# Patient Record
Sex: Female | Born: 2005
Health system: Southern US, Community
[De-identification: ages and names within clinical notes are randomized; demographics above are authoritative.]

## PROBLEM LIST (undated history)

## (undated) DIAGNOSIS — F419 Anxiety disorder, unspecified: Secondary | ICD-10-CM

## (undated) DIAGNOSIS — R5383 Other fatigue: Secondary | ICD-10-CM

## (undated) DIAGNOSIS — F900 Attention-deficit hyperactivity disorder, predominantly inattentive type: Secondary | ICD-10-CM

## (undated) DIAGNOSIS — J45909 Unspecified asthma, uncomplicated: Secondary | ICD-10-CM

## (undated) HISTORY — PX: ENDOTRACHEAL INTUBATION EMERGENT: SUR448

## (undated) HISTORY — PX: GASTROSTOMY TUBE PLACEMENT: SHX655

## (undated) HISTORY — DX: Anxiety disorder, unspecified: F41.9

## (undated) HISTORY — PX: FRACTURE SURGERY: SHX138

## (undated) HISTORY — DX: Other fatigue: R53.83

## (undated) HISTORY — DX: Unspecified asthma, uncomplicated: J45.909

## (undated) HISTORY — DX: Attention-deficit hyperactivity disorder, predominantly inattentive type: F90.0

---
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
## 2023-01-30 ENCOUNTER — Other Ambulatory Visit: Payer: Self-pay

## 2023-01-30 ENCOUNTER — Emergency Department (HOSPITAL_COMMUNITY)
Admission: EM | Admit: 2023-01-30 | Discharge: 2023-01-30 | Disposition: A | Discharge status: Home / Self Care | Payer: 59 | Attending: Emergency Medicine | Admitting: Emergency Medicine

## 2023-01-30 ENCOUNTER — Emergency Department (HOSPITAL_COMMUNITY): Payer: 59

## 2023-01-30 ENCOUNTER — Encounter (HOSPITAL_COMMUNITY): Payer: Self-pay

## 2023-01-30 DIAGNOSIS — R079 Chest pain, unspecified: Secondary | ICD-10-CM | POA: Insufficient documentation

## 2023-01-30 LAB — TROPONIN I (HIGH SENSITIVITY)
Troponin I (High Sensitivity): <2 ng/L (ref <18)
Troponin I (High Sensitivity): <2 ng/L (ref <18)

## 2023-01-30 LAB — BASIC METABOLIC PANEL
Anion gap: 10 (ref 5–15)
BUN: 9 mg/dL (ref 6–20)
CO2: 24 mmol/L (ref 22–32)
Calcium: 9.2 mg/dL (ref 8.9–10.3)
Chloride: 105 mmol/L (ref 98–111)
Creatinine, Ser: 0.81 mg/dL (ref 0.44–1.00)
GFR, Estimated: >60 mL/min (ref >60)
Glucose, Bld: 91 mg/dL (ref 70–99)
Potassium: 3.8 mmol/L (ref 3.5–5.1)
Sodium: 139 mmol/L (ref 135–145)

## 2023-01-30 LAB — CBC
HCT: 41.5 % (ref 36.0–46.0)
Hemoglobin: 13.7 g/dL (ref 12.0–15.0)
MCH: 30.3 pg (ref 26.0–34.0)
MCHC: 33 g/dL (ref 30.0–36.0)
MCV: 91.8 fL (ref 80.0–100.0)
Platelets: 278 10*3/uL (ref 150–400)
RBC: 4.52 MIL/uL (ref 3.87–5.11)
RDW: 12.4 % (ref 11.5–15.5)
WBC: 8.2 10*3/uL (ref 4.0–10.5)
nRBC: 0 % (ref 0.0–0.2)

## 2023-01-30 LAB — HCG, SERUM, QUALITATIVE: Preg, Serum: NEGATIVE

## 2023-01-30 LAB — D-DIMER, QUANTITATIVE: D-Dimer, Quant: 0.38 ug{FEU}/mL (ref 0.00–0.50)

## 2023-01-30 MED ORDER — CELECOXIB 200 MG PO CAPS: 200.0000 mg | ORAL_CAPSULE | Freq: Two times a day (BID) | ORAL | 0 refills | Status: AC

## 2023-01-30 MED ORDER — KETOROLAC TROMETHAMINE 15 MG/ML IJ SOLN
30.0000 mg | Freq: Once | INTRAMUSCULAR | Status: AC
  Administered 2023-01-30: 30 mg via INTRAVENOUS
  Filled 2023-01-30: qty 2

## 2023-01-30 NOTE — ED Triage Notes (Addendum)
Patient reports central chest pain that radiates to the left shoulder and increased SOB with movement x 2 days. Patient reports she took tums without relief. Patient denies cardiac hx.

## 2023-01-30 NOTE — ED Provider Notes (Signed)
[place] EMERGENCY DEPARTMENT AT [facility] Provider Note   CSN: [id number] Arrival date & time: 01/30/23  1254     History Chief Complaint  Patient presents with   Chest Pain    HPI Jennifer Franklin is a 17 y.o. female presenting for chief complaint of chest pain. .  States that her chest started hurting 2 days ago has been progressive in nature. Intermittent symptoms throughout the day today.  Currently present.  Worse with motion worse with palpation of the left anterior chest.  Denies fevers chills nausea vomiting syncope or shortness of breath.  Patient's recorded medical, surgical, social, medication list and allergies were reviewed in the Snapshot window as part of the initial history.   Review of Systems   Review of Systems  Constitutional:  Negative for chills and fever.  HENT:  Negative for ear pain and sore throat.   Eyes:  Negative for pain and visual disturbance.  Respiratory:  Negative for cough and shortness of breath.   Cardiovascular:  Positive for chest pain. Negative for palpitations.  Gastrointestinal:  Negative for abdominal pain and vomiting.  Genitourinary:  Negative for dysuria and hematuria.  Musculoskeletal:  Negative for arthralgias and back pain.  Skin:  Negative for color change and rash.  Neurological:  Negative for seizures and syncope.  All other systems reviewed and are negative.   Physical Exam Updated Vital Signs BP 128/84   Pulse 70   Temp 97.8 F (36.6 C) (Oral)   Resp 16   Ht 5\' 6"  (1.676 m)   Wt 90.7 kg   LMP 01/28/2023   SpO2 100%   BMI 32.28 kg/m  Physical Exam Vitals and nursing note reviewed.  Constitutional:      General: She is not in acute distress.    Appearance: She is well-developed.  HENT:     Head: Normocephalic and atraumatic.  Eyes:     Conjunctiva/sclera: Conjunctivae normal.  Cardiovascular:     Rate and Rhythm: Normal rate and regular rhythm.     Heart sounds: No murmur heard. Pulmonary:      Effort: Pulmonary effort is normal. No respiratory distress.     Breath sounds: Normal breath sounds.  Abdominal:     Palpations: Abdomen is soft.     Tenderness: There is no abdominal tenderness.  Musculoskeletal:        General: No swelling.     Cervical back: Neck supple.  Skin:    General: Skin is warm and dry.     Capillary Refill: Capillary refill takes less than 2 seconds.  Neurological:     Mental Status: She is alert.  Psychiatric:        Mood and Affect: Mood normal.      ED Course/ Medical Decision Making/ A&P    Procedures Procedures   Medications Ordered in ED Medications  ketorolac (TORADOL) 15 MG/ML injection 30 mg (30 mg Intravenous Given 01/30/23 1609)   Medical Decision Making: Jennifer Franklin is a 17 y.o. female who presented to the ED today with chest pain, detailed above.  Based on patient's comorbidities, patient has a heart score of 2.    Patient placed on continuous vitals and telemetry monitoring while in ED which was reviewed periodically.  Complete initial physical exam performed, notably the patient was HDS in NAD.   Reviewed and confirmed nursing documentation for past medical history, family history, social history.    Initial Assessment: With the patient's presentation of left-sided chest pain,  most likely diagnosis is musculoskeletal chest pain versus GERD, although ACS remains on the differential. Other diagnoses were considered including (but not limited to) pulmonary embolism, community-acquired pneumonia, aortic dissection, pneumothorax, underlying bony abnormality, anemia. These are considered less likely due to history of present illness and physical exam findings.    In particular, concerning pulmonary embolism: Patient is PERC + and the they deny malignancy, recent surgery, history of DVT, or calf tenderness leading to a low risk Wells score. Aortic Dissection also reconsidered but seems less likely based on the location, quality,  onset, and severity of symptoms in this case. Patient has a lack of serious comorbidities for this condition including a lack of HTN or Smoking. Patient also has a lack of underlying history of AD or TAA.  This is most consistent with an acute life/limb threatening illness complicated by underlying chronic conditions.   Initial Plan: Evaluate for ACS with delta troponin and EKG evaluated as below  Evaluate for dissection, bony abnormality, or pneumonia with chest x-ray and screening laboratory evaluation including CBC, BMP  Further evaluation for pulmonary embolism not indicated at this time based on patient's PERC and Wells score.  Further evaluation for Thoracic Aortic Dissection not indicated at this time based on patient's clinical history and PE findings.   Initial Study Results: EKG was reviewed independently. Rate, rhythm, axis, intervals all examined and without medically relevant abnormality. ST segments without concerns for elevations.    Laboratory  Delta troponin demonstrated NAA  Ddimer: Negative CBC and BMP without obvious metabolic or inflammatory abnormalities requiring further evaluation   Radiology  DG Chest 2 View  Result Date: 01/30/2023 CLINICAL DATA:  Chest pain. EXAM: CHEST - 2 VIEW COMPARISON:  Chest x-ray 11/21/2021. FINDINGS: The heart size and mediastinal contours are within normal limits. Both lungs are clear. No visible pleural effusions or pneumothorax. No acute osseous abnormality. IMPRESSION: No active cardiopulmonary disease. Electronically Signed   By: Feliberto Harts M.D.   On: 01/30/2023 15:24    Final Assessment and Plan: On repeat clinical assessment, patient has had complete symptomatic resolution after administration of Toradol.  They are currently ambulatory tolerating p.o. intake.  They deny fevers or chills, nausea vomiting, syncope shortness of breath.  Favor likely musculoskeletal versus gastroesophageal etiology of patient's symptoms.   Considered ACS grossly less likely given resolution, well appearance and negative troponin and low HEART score.  Patient to follow-up with primary care provider within 48 hours for ongoing care and management.              Clinical Impression:  1. Chest pain, unspecified type      Discharge   Final Clinical Impression(s) / ED Diagnoses Final diagnoses:  Chest pain, unspecified type    Rx / DC Orders ED Discharge Orders          Ordered    celecoxib (CELEBREX) 200 MG capsule  2 times daily        01/30/23 1751              Glyn Ade, MD 01/30/23 2325

## 2023-02-03 NOTE — Plan of Care (Signed)
[facility] Tonsillectomy/Adenoidectomy, Postoperative PEDS care plan entered in error.

## 2024-02-19 ENCOUNTER — Emergency Department (HOSPITAL_COMMUNITY)
Admission: EM | Admit: 2024-02-19 | Discharge: 2024-02-19 | Disposition: A | Discharge status: Home / Self Care | Attending: Emergency Medicine | Admitting: Emergency Medicine

## 2024-02-19 ENCOUNTER — Encounter (HOSPITAL_COMMUNITY): Payer: Self-pay

## 2024-02-19 ENCOUNTER — Other Ambulatory Visit: Payer: Self-pay

## 2024-02-19 DIAGNOSIS — T7840XA Allergy, unspecified, initial encounter: Secondary | ICD-10-CM | POA: Insufficient documentation

## 2024-02-19 DIAGNOSIS — J45909 Unspecified asthma, uncomplicated: Secondary | ICD-10-CM | POA: Diagnosis not present

## 2024-02-19 LAB — I-STAT CHEM 8, ED
BUN: 9 mg/dL (ref 6–20)
Calcium, Ion: 1.19 mmol/L (ref 1.15–1.40)
Chloride: 107 mmol/L (ref 98–111)
Creatinine, Ser: 0.9 mg/dL (ref 0.44–1.00)
Glucose, Bld: 96 mg/dL (ref 70–99)
HCT: 37 % (ref 36.0–46.0)
Hemoglobin: 12.6 g/dL (ref 12.0–15.0)
Potassium: 3.3 mmol/L — ABNORMAL LOW (ref 3.5–5.1)
Sodium: 143 mmol/L (ref 135–145)
TCO2: 23 mmol/L (ref 22–32)

## 2024-02-19 MED ORDER — METHYLPREDNISOLONE SODIUM SUCC 125 MG IJ SOLR
125.0000 mg | Freq: Once | INTRAMUSCULAR | Status: AC
  Administered 2024-02-19: 125 mg via INTRAVENOUS
  Filled 2024-02-19: qty 2

## 2024-02-19 MED ORDER — PREDNISONE 20 MG PO TABS: 40.0000 mg | ORAL_TABLET | Freq: Every day | ORAL | 0 refills | Status: AC

## 2024-02-19 MED ORDER — EPINEPHRINE 0.3 MG/0.3ML IJ SOAJ: 0.3000 mg | INTRAMUSCULAR | 0 refills | Status: AC | PRN

## 2024-02-19 NOTE — ED Provider Notes (Signed)
 [place] EMERGENCY DEPARTMENT AT [facility] Provider Note   CSN: [id number] Arrival date & time: 02/19/24  1519     Patient presents with: Allergic Reaction   Jay D Rigsbee is a 18 y.o. female.  {Add pertinent medical, surgical, social history, OB history to YEP:67052}  Allergic Reaction    Patient has a history of ADHD, asthma, D-Phen pork allergy.  Patient states she was eating a turkey sandwich about 45 minutes ago.  Patient started having nausea feeling dizzy some throat swelling.  Patient also developed headache.  No rash noted.  Patient administered an expired EpiPen .  Patient feels somewhat better although mostly jittery right now.  EMS administered Zofran  and Benadryl .  Prior to Admission medications   Medication Sig Start Date End Date Taking? Authorizing Provider  celecoxib  (CELEBREX ) 200 MG capsule Take 1 capsule (200 mg total) by mouth 2 (two) times daily. 01/30/23   Jerral Meth, MD  ondansetron  (ZOFRAN ) 8 MG tablet Take 1 tablet (8 mg total) by mouth every 8 (eight) hours as needed for nausea or vomiting. 11/21/21   Levander Houston, MD  VYVANSE 20 MG CHEW Chew 1 tablet by mouth every morning. 11/17/20   [provider]    Allergies: Bovine (beef) protein-containing drug products, Porcine (pork) protein-containing drug products, and Milk-related compounds    Review of Systems  Updated Vital Signs BP 126/73   Pulse 89   Temp 97.7 F (36.5 C) (Oral)   Resp 18   SpO2 99%   Physical Exam Vitals and nursing note reviewed.  Constitutional:      General: She is not in acute distress.    Appearance: She is well-developed.  HENT:     Head: Normocephalic and atraumatic.     Comments: No swelling noted of the tongue, oropharynx, no angioedema    Right Ear: External ear normal.     Left Ear: External ear normal.  Eyes:     General: No scleral icterus.       Right eye: No discharge.        Left eye: No discharge.     Conjunctiva/sclera:  Conjunctivae normal.  Neck:     Trachea: No tracheal deviation.  Cardiovascular:     Rate and Rhythm: Normal rate and regular rhythm.  Pulmonary:     Effort: Pulmonary effort is normal. No respiratory distress.     Breath sounds: Normal breath sounds. No stridor. No wheezing or rales.  Abdominal:     General: Bowel sounds are normal. There is no distension.     Palpations: Abdomen is soft.     Tenderness: There is no abdominal tenderness. There is no guarding or rebound.  Musculoskeletal:        General: No tenderness or deformity.     Cervical back: Neck supple.  Skin:    General: Skin is warm and dry.     Findings: No rash. Rash is not urticarial.     Comments: No rash no aortic  Neurological:     General: No focal deficit present.     Mental Status: She is alert.     Cranial Nerves: No cranial nerve deficit, dysarthria or facial asymmetry.     Sensory: No sensory deficit.     Motor: No abnormal muscle tone or seizure activity.     Coordination: Coordination normal.  Psychiatric:        Mood and Affect: Mood normal.     (all labs ordered are listed, but only abnormal  results are displayed) Labs Reviewed - No data to display  EKG: None  Radiology: No results found.  {Document cardiac monitor, telemetry assessment procedure when appropriate:32947} Procedures   Medications Ordered in the ED  methylPREDNISolone sodium succinate (SOLU-MEDROL) 125 mg/2 mL injection 125 mg (has no administration in time range)      {Click here for ABCD2, HEART and other calculators REFRESH Note before signing:1}                              Medical Decision Making Risk Prescription drug management.   ***  {Document critical care time when appropriate  Document review of labs and clinical decision tools ie CHADS2VASC2, etc  Document your independent review of radiology images and any outside records  Document your discussion with family members, caretakers and with consultants   Document social determinants of health affecting pt's care  Document your decision making why or why not admission, treatments were needed:32947:::1}   Final diagnoses:  None    ED Discharge Orders     None

## 2024-02-19 NOTE — ED Triage Notes (Signed)
 BIBA for allergic reaction. Pt reports she ate a turkey sandwich 45 min ago- then began to feel dizziness, nausea, throat swelling. Pork and beef allergy. Pt administered epi-pen that expired in  2023.4 mg zofran , 50 mg benadryl  given PTA. 20 g LAC 105 hr 130/76 bp 98% r/a

## 2024-02-19 NOTE — Discharge Instructions (Addendum)
 Return if symptoms worsen.  Use EpiPen  if you have any severe respiratory issues and concern for allergic reaction.  Continue Benadryl  as needed.  Take prednisone/steroids with your next dose tomorrow.

## 2024-02-19 NOTE — ED Provider Notes (Signed)
 On my reevaluation patient feels much better.  Symptoms have resolved.  Will give prescription for EpiPen  and prednisone continue Benadryl  at home.  Overall likely allergic reaction in the setting of food exposure.  History of the same.  Discharge.  This chart was dictated using voice recognition software.  Despite best efforts to proofread,  errors can occur which can change the documentation meaning.    Jennifer Cornet, DO 02/19/24 8191

## 2024-02-19 NOTE — ED Notes (Signed)
 Pt axox4. GCS 15. Pt verbalizes understanding of discharge instructions, follow up and new Rx.  Pt ambulated out of er with steady gait to transportation home with mother.
